# Patient Record
Sex: Male | Born: 1935 | Race: White | Hispanic: No | Smoking: Never smoker
Health system: Southern US, Community
[De-identification: ages and names within clinical notes are randomized; demographics above are authoritative.]

## PROBLEM LIST (undated history)

## (undated) DIAGNOSIS — N429 Disorder of prostate, unspecified: Secondary | ICD-10-CM

## (undated) DIAGNOSIS — K219 Gastro-esophageal reflux disease without esophagitis: Secondary | ICD-10-CM

---

## 2015-04-08 ENCOUNTER — Emergency Department
Admission: EM | Admit: 2015-04-08 | Discharge: 2015-04-08 | Disposition: A | Discharge status: Home / Self Care | Payer: Medicare Other | Attending: Emergency Medicine | Admitting: Emergency Medicine

## 2015-04-08 ENCOUNTER — Encounter: Payer: Self-pay | Admitting: Emergency Medicine

## 2015-04-08 ENCOUNTER — Emergency Department: Payer: Medicare Other

## 2015-04-08 DIAGNOSIS — R404 Transient alteration of awareness: Secondary | ICD-10-CM | POA: Insufficient documentation

## 2015-04-08 DIAGNOSIS — N390 Urinary tract infection, site not specified: Secondary | ICD-10-CM | POA: Insufficient documentation

## 2015-04-08 DIAGNOSIS — R4182 Altered mental status, unspecified: Secondary | ICD-10-CM | POA: Diagnosis present

## 2015-04-08 DIAGNOSIS — R001 Bradycardia, unspecified: Secondary | ICD-10-CM | POA: Diagnosis not present

## 2015-04-08 DIAGNOSIS — Z79899 Other long term (current) drug therapy: Secondary | ICD-10-CM | POA: Diagnosis not present

## 2015-04-08 HISTORY — DX: Gastro-esophageal reflux disease without esophagitis: K21.9

## 2015-04-08 HISTORY — DX: Disorder of prostate, unspecified: N42.9

## 2015-04-08 LAB — CBC WITH DIFFERENTIAL/PLATELET
BASOS ABS: 0 10*3/uL (ref 0–0.1)
BASOS PCT: 1 %
Eosinophils Absolute: 0.1 10*3/uL (ref 0–0.7)
Eosinophils Relative: 4 %
HEMATOCRIT: 34.2 % — AB (ref 40.0–52.0)
HEMOGLOBIN: 11.1 g/dL — AB (ref 13.0–18.0)
Lymphocytes Relative: 20 %
Lymphs Abs: 0.7 10*3/uL — ABNORMAL LOW (ref 1.0–3.6)
MCH: 29.7 pg (ref 26.0–34.0)
MCHC: 32.4 g/dL (ref 32.0–36.0)
MCV: 91.6 fL (ref 80.0–100.0)
MONOS PCT: 9 %
Monocytes Absolute: 0.3 10*3/uL (ref 0.2–1.0)
NEUTROS ABS: 2.5 10*3/uL (ref 1.4–6.5)
NEUTROS PCT: 68 %
Platelets: 179 10*3/uL (ref 150–440)
RBC: 3.73 MIL/uL — ABNORMAL LOW (ref 4.40–5.90)
RDW: 16.1 % — ABNORMAL HIGH (ref 11.5–14.5)
WBC: 3.8 10*3/uL (ref 3.8–10.6)

## 2015-04-08 LAB — COMPREHENSIVE METABOLIC PANEL
ALBUMIN: 3.1 g/dL — AB (ref 3.5–5.0)
ALT: <5 U/L — ABNORMAL LOW (ref 17–63)
ANION GAP: 8 (ref 5–15)
AST: 20 U/L (ref 15–41)
Alkaline Phosphatase: 76 U/L (ref 38–126)
BILIRUBIN TOTAL: 0.7 mg/dL (ref 0.3–1.2)
BUN: 17 mg/dL (ref 6–20)
CO2: 26 mmol/L (ref 22–32)
Calcium: 8.7 mg/dL — ABNORMAL LOW (ref 8.9–10.3)
Chloride: 105 mmol/L (ref 101–111)
Creatinine, Ser: 0.88 mg/dL (ref 0.61–1.24)
GFR calc Af Amer: >60 mL/min (ref >60)
GFR calc non Af Amer: >60 mL/min (ref >60)
GLUCOSE: 83 mg/dL (ref 65–99)
POTASSIUM: 3.7 mmol/L (ref 3.5–5.1)
Sodium: 139 mmol/L (ref 135–145)
TOTAL PROTEIN: 6.7 g/dL (ref 6.5–8.1)

## 2015-04-08 LAB — URINALYSIS COMPLETE WITH MICROSCOPIC (ARMC ONLY)
Bilirubin Urine: NEGATIVE
Glucose, UA: NEGATIVE mg/dL
Ketones, ur: NEGATIVE mg/dL
NITRITE: POSITIVE — AB
PROTEIN: NEGATIVE mg/dL
SPECIFIC GRAVITY, URINE: 1.009 (ref 1.005–1.030)
pH: 6 (ref 5.0–8.0)

## 2015-04-08 LAB — TROPONIN I

## 2015-04-08 MED ORDER — CEFTRIAXONE SODIUM 1 G IJ SOLR
1.0000 g | Freq: Once | INTRAMUSCULAR | Status: AC
  Administered 2015-04-08: 1 g via INTRAVENOUS
  Filled 2015-04-08: qty 10

## 2015-04-08 MED ORDER — SULFAMETHOXAZOLE-TRIMETHOPRIM 400-80 MG PO TABS: 1.0000 | ORAL_TABLET | Freq: Two times a day (BID) | ORAL | Status: AC

## 2015-04-08 NOTE — ED Notes (Signed)
[facility] notified for transport

## 2015-04-08 NOTE — ED Provider Notes (Addendum)
[facility] Emergency Department Provider Note  ____________________________________________  Time seen: Approximately 2:42 PM  I have reviewed the triage vital signs and the nursing notes.   HISTORY  Chief Complaint Altered Mental Status  History and physical exam are limited due to patient dementia.  HPI Mitchell Hawkins is a 81 y.o. male with a history of GERD and prostate disease who lives at [facility], brought today for unresponsiveness. The patient is unable to tell me why he is here. He states that he, "hurts all over."  Per nurse manager, CNA went to feed him and he could not be aroused despite verbal stimulus.  Some garbled speech, R facial droop with sternal rub.  140/66, 48, sats 94% on RA, 96.7  No recent illness or changes in medications.  Baseline: clear speech, not ambulatory.  HR ususally runs 52-70.   Past Medical History  Diagnosis Date  . GERD (gastroesophageal reflux disease)   . Prostate disease     There are no active problems to display for this patient.   History reviewed. No pertinent past surgical history.  Current Outpatient Rx  Name  Route  Sig  Dispense  Refill  . albuterol (PROVENTIL HFA;VENTOLIN HFA) 108 (90 BASE) MCG/ACT inhaler   Inhalation   Inhale 2 puffs into the lungs 3 (three) times daily as needed for wheezing or shortness of breath.         . calcium citrate-vitamin D (CITRACAL+D) 315-200 MG-UNIT per tablet   Oral   Take 1 tablet by mouth 2 (two) times daily.         . carbidopa-levodopa (SINEMET CR) 50-200 MG per tablet   Oral   Take 0.5 tablets by mouth 2 (two) times daily.         . carbidopa-levodopa (SINEMET CR) 50-200 MG per tablet   Oral   Take 1 tablet by mouth 2 (two) times daily.         Marland Kitchen ENSURE PLUS (ENSURE PLUS) LIQD   Oral   Take 237 mLs by mouth.         . finasteride (PROSCAR) 5 MG tablet   Oral   Take 5 mg by mouth daily.         Marland Kitchen ketoconazole (NIZORAL) 2 %  shampoo   Topical   Apply 1 application topically 2 (two) times a week. Monday and Friday         . Melatonin 3 MG TABS   Oral   Take 1 tablet by mouth at bedtime.         . miconazole (MICOTIN) 2 % powder   Topical   Apply 1 application topically as needed for itching.         . mirtazapine (REMERON) 15 MG tablet   Oral   Take 15 mg by mouth at bedtime.         . Multiple Vitamins-Minerals (MULTIVITAMIN WITH MINERALS) tablet   Oral   Take 1 tablet by mouth daily.         Marland Kitchen Petrolatum-Zinc Oxide (SENSI-CARE PROTECTIVE BARRIER) 49-15 % OINT   Apply externally   Apply 1 application topically 2 (two) times daily.         Marland Kitchen senna (SENOKOT) 8.6 MG TABS tablet   Oral   Take 2 tablets by mouth daily.         . tamsulosin (FLOMAX) 0.4 MG CAPS capsule   Oral   Take 0.4 mg by mouth daily.         Marland Kitchen  vitamin B-12 (CYANOCOBALAMIN) 1000 MCG tablet   Oral   Take 1,000 mcg by mouth daily.         Marland Kitchen sulfamethoxazole-trimethoprim (BACTRIM) 400-80 MG per tablet   Oral   Take 1 tablet by mouth 2 (two) times daily.   20 tablet   0     Allergies Review of patient's allergies indicates no known allergies.  History reviewed. No pertinent family history.  Social History Social History  Substance Use Topics  . Smoking status: Never Smoker   . Smokeless tobacco: None  . Alcohol Use: None    Review of Systems Unable to obtain due to patient patient mental status.   ____________________________________________   PHYSICAL EXAM:  VITAL SIGNS: ED Triage Vitals  Enc Vitals Group     BP 04/08/15 1434 126/72 mmHg     Pulse Rate 04/08/15 1434 52     Resp 04/08/15 1434 16     Temp 04/08/15 1434 98.9 F (37.2 C)     Temp Source 04/08/15 1434 Oral     SpO2 04/08/15 1434 97 %     Weight 04/08/15 1434 170 lb (77.111 kg)     Height 04/08/15 1434 5\' 10"  (1.778 m)     Head Cir --      Peak Flow --      Pain Score 04/08/15 1435 1     Pain Loc --      Pain Edu?  --      Excl. in GC? --     Constitutional: Alert and oriented to year, month, and name. The patient does not know where he has.. No acute distress. Eyes: At baseline, eyes remain closed. When I open them manually, he has symmetric pupils. Unable to comply with extraocular motion testing. Head: Atraumatic. Nose: No congestion/rhinnorhea. Mouth/Throat: Mucous membranes are dry. Neck: No stridor.  Supple.  No meningismus. Cardiovascular: Slow rate, regular rhythm. No murmurs, rubs or gallops.  Respiratory: Normal respiratory effort.  No retractions. Lungs CTAB.  No wheezes, rales or ronchi. Gastrointestinal: Soft and nontender. No distention. No peritoneal signs. Musculoskeletal: No LE edema. No calf tenderness or palpable cords. Neurologic:  Clear speech. Able to answer some questions appropriately. Able to wiggle toes bilaterally but unable to lift legs. Skin:  Skin is warm, dry and intact. No rash noted. Psychiatric: Mood and affect are normal.  ____________________________________________   LABS (all labs ordered are listed, but only abnormal results are displayed)  Labs Reviewed  COMPREHENSIVE METABOLIC PANEL - Abnormal; Notable for the following:    Calcium 8.7 (*)    Albumin 3.1 (*)    ALT <5 (*)    All other components within normal limits  CBC WITH DIFFERENTIAL/PLATELET - Abnormal; Notable for the following:    RBC 3.73 (*)    Hemoglobin 11.1 (*)    HCT 34.2 (*)    RDW 16.1 (*)    Lymphs Abs 0.7 (*)    All other components within normal limits  URINALYSIS COMPLETEWITH MICROSCOPIC ([facility] ONLY) - Abnormal; Notable for the following:    Color, Urine YELLOW (*)    APPearance HAZY (*)    Hgb urine dipstick 1+ (*)    Nitrite POSITIVE (*)    Leukocytes, UA 3+ (*)    Bacteria, UA RARE (*)    Squamous Epithelial / LPF 0-5 (*)    All other components within normal limits  TROPONIN I   ____________________________________________  EKG  ED ECG REPORT I, Rockne Menghini, the attending physician,  personally viewed and interpreted this ECG.   Date: 04/08/2015  EKG Time: 14:34   Rate: 56  Rhythm: sinus bradycardia  Axis: Leftward axis   Intervals:first-degree A-V block   ST&T Change: Poor baseline tracing. Nonspecific T-wave inversion in V1. No ST elevations.  No previous EKGs for comparison. ____________________________________________  RADIOLOGY  Dg Chest 2 View  04/08/2015   CLINICAL DATA:  Unresponsive.  EXAM: CHEST  2 VIEW  COMPARISON:  None.  FINDINGS: There is generalized interstitial prominence of uncertain etiology or chronicity. There is no airspace consolidation. There is mild elevation of the right hemidiaphragm. Heart is borderline enlarged with pulmonary vascularity within normal limits. There is atherosclerotic change in the aorta. There is degenerative change in the thoracic spine. There is evidence of previous kyphoplasty procedure at L1. No blastic or lytic lesions are appreciable. No demonstrable adenopathy.  IMPRESSION: Question a degree of congestive heart failure versus chronic interstitial fibrosis. Without prior studies, chronicity of the current findings cannot be ascertained. No airspace consolidation. Heart mildly enlarged. No adenopathy apparent.   Electronically Signed   By: Bretta Bang III M.D.   On: 04/08/2015 15:53   Ct Head Wo Contrast  04/08/2015   CLINICAL DATA:  Unresponsive.  EXAM: CT HEAD WITHOUT CONTRAST  TECHNIQUE: Contiguous axial images were obtained from the base of the skull through the vertex without intravenous contrast.  COMPARISON:  None.  FINDINGS: Mild cerebral atrophy. No acute intracranial abnormality. Specifically, no hemorrhage, hydrocephalus, mass lesion, acute infarction, or significant intracranial injury. No acute calvarial abnormality. 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 Visualized paranasal sinuses and mastoids clear. Orbital soft tissues unremarkable.   IMPRESSION: No acute intracranial abnormality.   Electronically Signed   By: Charlett Nose M.D.   On: 04/08/2015 15:34    ____________________________________________   PROCEDURES  Procedure(s) performed: None  Critical Care performed: No ____________________________________________   INITIAL IMPRESSION / ASSESSMENT AND PLAN / ED COURSE  Pertinent labs & imaging results that were available during my care of the patient were reviewed by me and considered in my medical decision making (see chart for details).  81 year old nursing home patient who sent for unresponsiveness. At this time, he is not unresponsive and his vital signs are normal except for sinus bradycardia. He has no findings on physical exam that would be concerning for acute pulmonary or abdominal infection. I will get basic labs, EKG, urinalysis, and attempt to discuss with patient's nurse to further elucidate the event that occurred this morning.  ----------------------------------------- 3:01 PM on 04/08/2015 -----------------------------------------  81 year old nursing home patient who is new to [facility]. He had a self-limited episode of unresponsiveness the smear morning. I will evaluate for intracranial bleed or stroke, abnormal labs, UTI. At this time, patient has sinus bradycardia which is likely his baseline given with the nursing home is told me. I will also call his son to get more information.  ----------------------------------------- 3:39 PM on 04/08/2015 -----------------------------------------  Patient CT head shows no acute intracranial abnormality.  ----------------------------------------- 4:45 PM on 04/08/2015 -----------------------------------------  The patient's UA does exhibit signs of urinary tract infection. Will initiate antibiotics, first dose here. Awaiting remaining labs and reassessment for further disposition.  ----------------------------------------- 4:52 PM on  04/08/2015 -----------------------------------------   D/W pt's son who is his POA.  He reports recurrent UTI's, and states that pt's MS, as described by me, is his baseline.  He is not planning to come tonight and understands that this limits my ability to  confirm that the pt is at his baseline.  We discussed abx and discharge versus admission, and the pt's son opts for discharge.  Pt's son is planning to transfer pt home tomorrow, and understands return and f/u precautions.    ____________________________________________  FINAL CLINICAL IMPRESSION(S) / ED DIAGNOSES  Final diagnoses:  Transient alteration of awareness  UTI (lower urinary tract infection)  Bradycardia      NEW MEDICATIONS STARTED DURING THIS VISIT:  New Prescriptions   SULFAMETHOXAZOLE-TRIMETHOPRIM (BACTRIM) 400-80 MG PER TABLET    Take 1 tablet by mouth 2 (two) times daily.     Rockne Menghini, MD 04/08/15 1653  Rockne Menghini, MD 04/08/15 1700  Rockne Menghini, MD 04/08/15 [phone]

## 2015-04-08 NOTE — ED Notes (Signed)
Pt sent from [facility].  Called ems for unresponsive.  EMS states when they got there pt was alert.  A&0 on arrival to ED.  Pt arrived with DNR paper.

## 2015-04-08 NOTE — ED Notes (Signed)
Patient transported to X-ray 

## 2015-04-08 NOTE — Discharge Instructions (Signed)
Altered Mental Status Altered mental status most often refers to an abnormal change in your responsiveness and awareness. It can affect your speech, thought, mobility, memory, attention span, or alertness. It can range from slight confusion to complete unresponsiveness (coma). Altered mental status can be a sign of a serious underlying medical condition. Rapid evaluation and medical treatment is necessary for patients having an altered mental status. CAUSES   Low blood sugar (hypoglycemia) or diabetes.  Severe loss of body fluids (dehydration) or a body salt (electrolyte) imbalance.  A stroke or other neurologic problem, such as dementia or delirium.  A head injury or tumor.  A drug or alcohol overdose.  Exposure to toxins or poisons.  Depression, anxiety, and stress.  A low oxygen level (hypoxia).  An infection.  Blood loss.  Twitching or shaking (seizure).  Heart problems, such as heart attack or heart rhythm problems (arrhythmias).  A body temperature that is too low or too high (hypothermia or hyperthermia). DIAGNOSIS  A diagnosis is based on your history, symptoms, physical and neurologic examinations, and diagnostic tests. Diagnostic tests may include:  Measurement of your blood pressure, pulse, breathing, and oxygen levels (vital signs).  Blood tests.  Urine tests.  X-ray exams.  A computerized magnetic scan (magnetic resonance imaging, MRI).  A computerized X-ray scan (computed tomography, CT scan). TREATMENT  Treatment will depend on the cause. Treatment may include:  Management of an underlying medical or mental health condition.  Critical care or support in the hospital. HOME CARE INSTRUCTIONS   Only take over-the-counter or prescription medicines for pain, discomfort, or fever as directed by your caregiver.  Manage underlying conditions as directed by your caregiver.  Eat a healthy, well-balanced diet to maintain strength.  Join a support group or  prevention program to cope with the condition or trauma that caused the altered mental status. Ask your caregiver to help choose a program that works for you.  Follow up with your caregiver for further examination, therapy, or testing as directed. SEEK MEDICAL CARE IF:   You feel unwell or have chills.  You or your family notice a change in your behavior or your alertness.  You have trouble following your caregiver's treatment plan.  You have questions or concerns. SEEK IMMEDIATE MEDICAL CARE IF:   You have a rapid heartbeat or have chest pain.  You have difficulty breathing.  You have a fever.  You have a headache with a stiff neck.  You cough up blood.  You have blood in your urine or stool.  You have severe agitation or confusion. MAKE SURE YOU:   Understand these instructions.  Will watch your condition.  Will get help right away if you are not doing well or get worse. Document Released: 12/28/2009 Document Revised: 10/02/2011 Document Reviewed: 12/28/2009 [facility] Patient Information 2015 [place], [place]. This information is not intended to replace advice given to you by your health care provider. Make sure you discuss any questions you have with your health care provider.   Please start the Bactrim tablets tomorrow morning.  Please contact Mr. Juanetta Gosling primary care physician to review any prior urine cultures, as I do not have access to these in the [facility] emergency department. This may change the antibiotic that he needs to be prescribed.  Please return to the emergency department for fever, vomiting, changes in mental status, abdominal pain, or any other symptoms concerning to you.

## 2015-10-23 DEATH — deceased

## 2015-11-22 DEATH — deceased

## 2016-08-30 IMAGING — CT CT HEAD W/O CM
1 series · 12 of 29 positions shown, 15 images · non-contrast
Comparison: None.

CLINICAL DATA: Unresponsive.

EXAM:
CT HEAD WITHOUT CONTRAST
TECHNIQUE: Contiguous axial images were obtained from the base of the skull
through the vertex without intravenous contrast.

[Series 3: thorax 5.0 spo · axial · 0.35mm/px · z∈[-141,-24]mm · 12 of 29 slices shown, 15 images]
[im 3/29  brain]
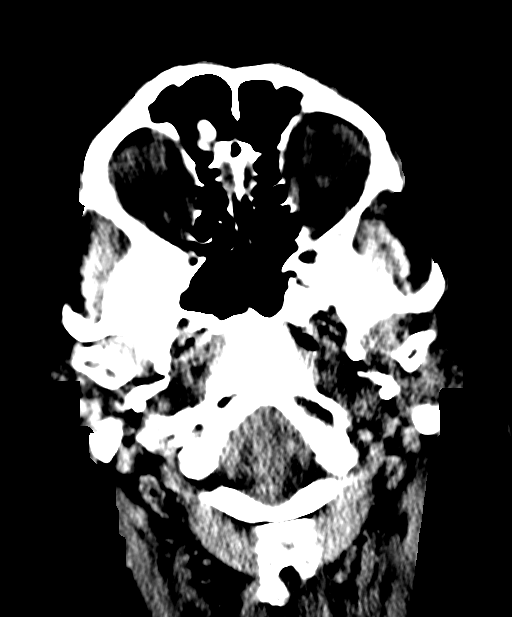
[im 3/29  bone]
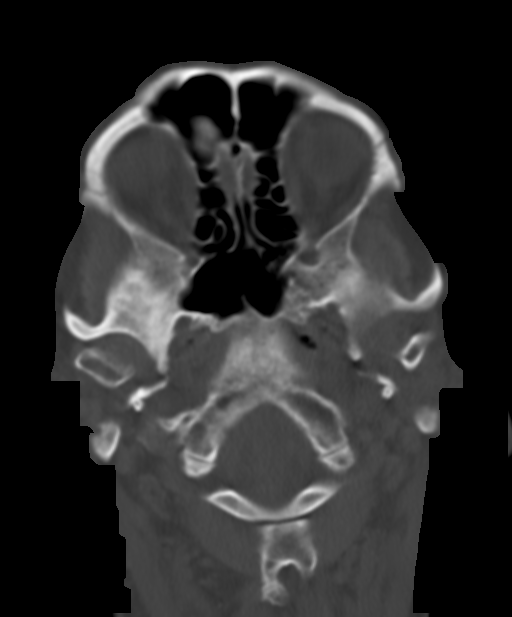
[im 5/29  brain]
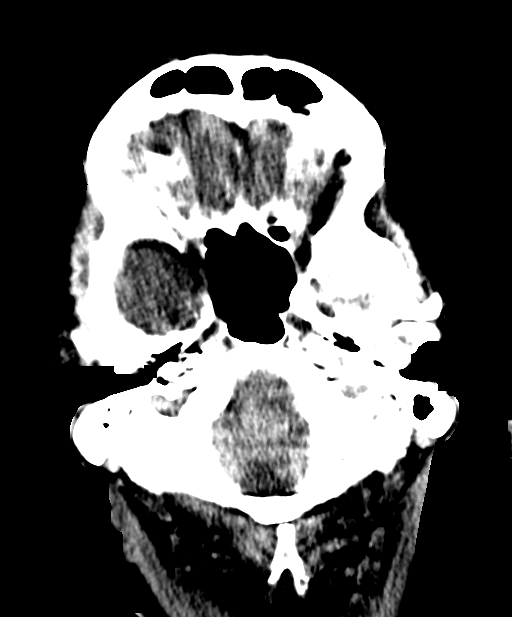
[im 7/29  brain]
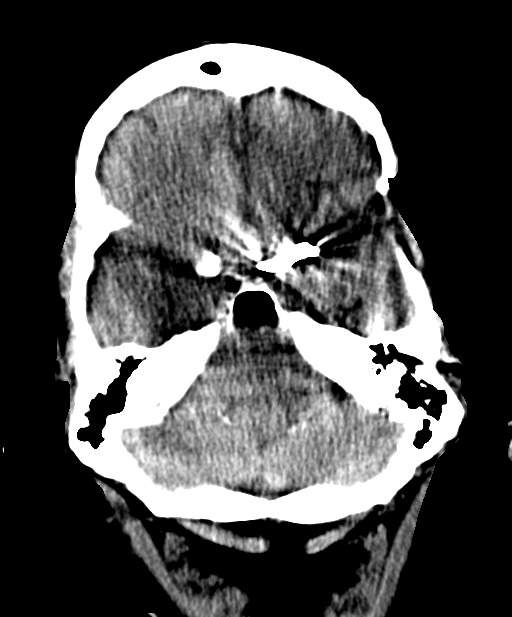
[im 9/29  brain]
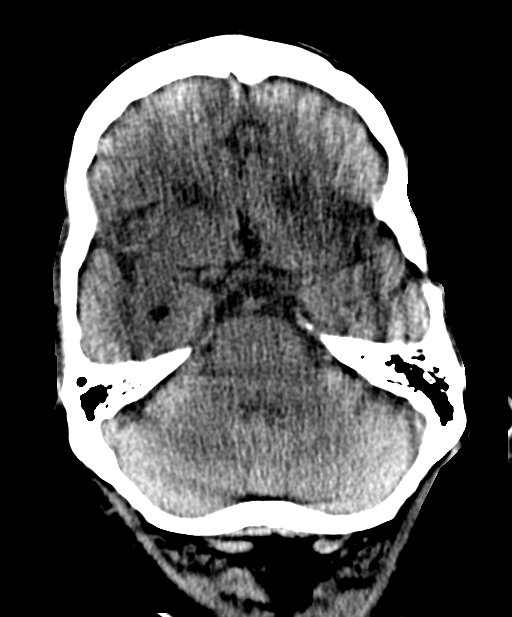
[im 12/29  brain]
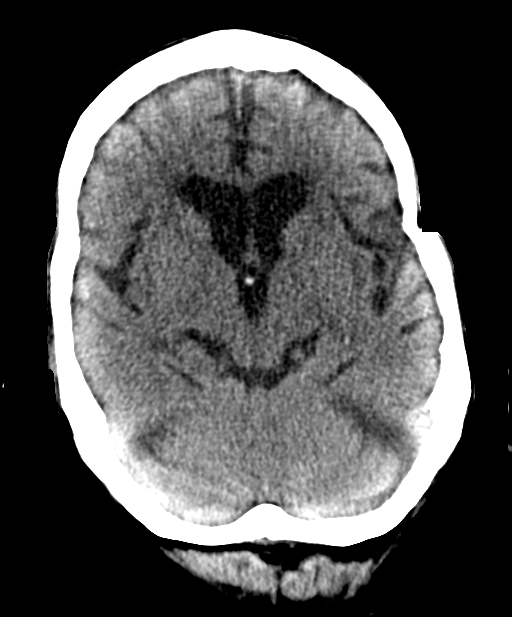
[im 12/29  bone]
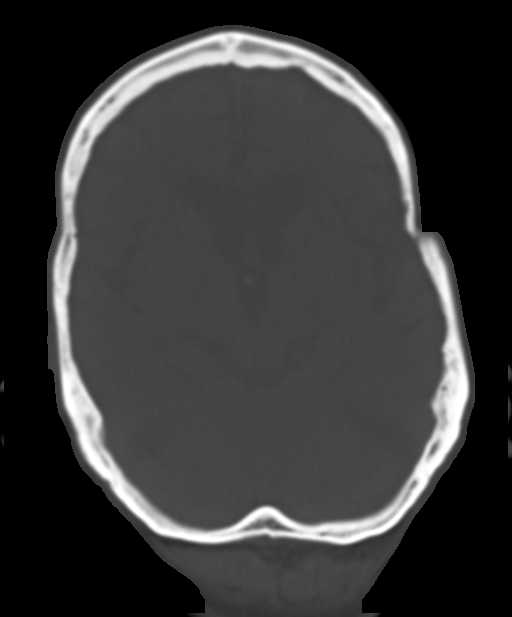
[im 14/29  brain]
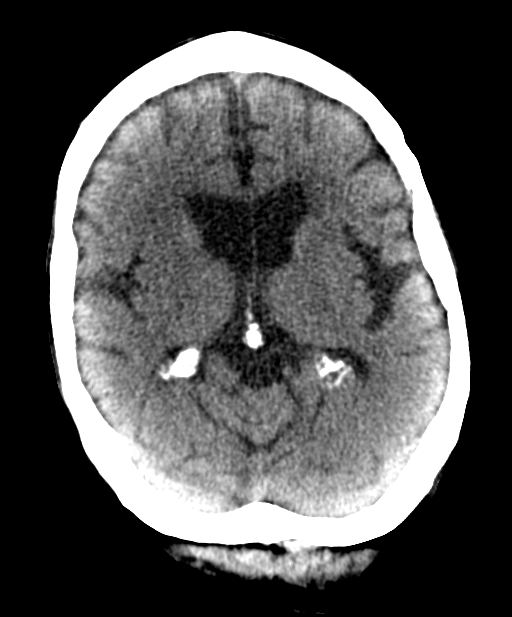
[im 16/29  brain]
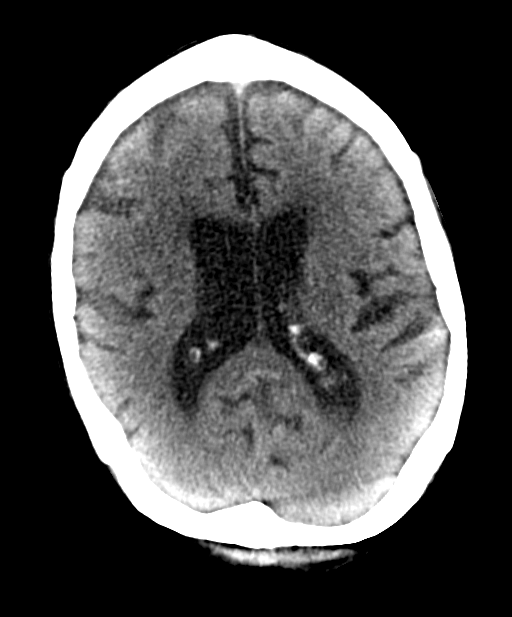
[im 18/29  brain]
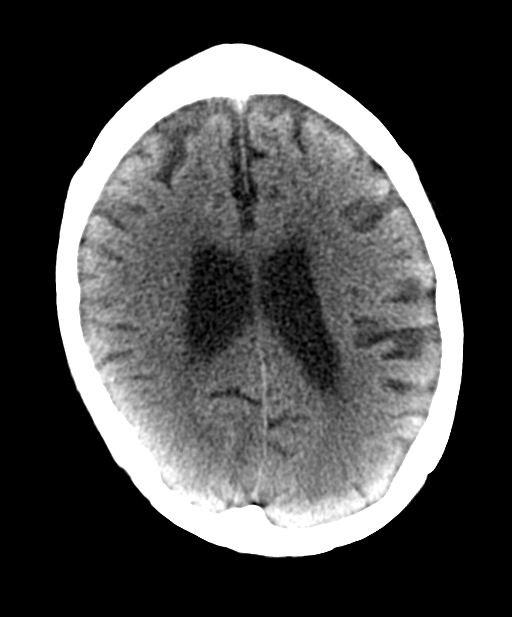
[im 21/29  brain]
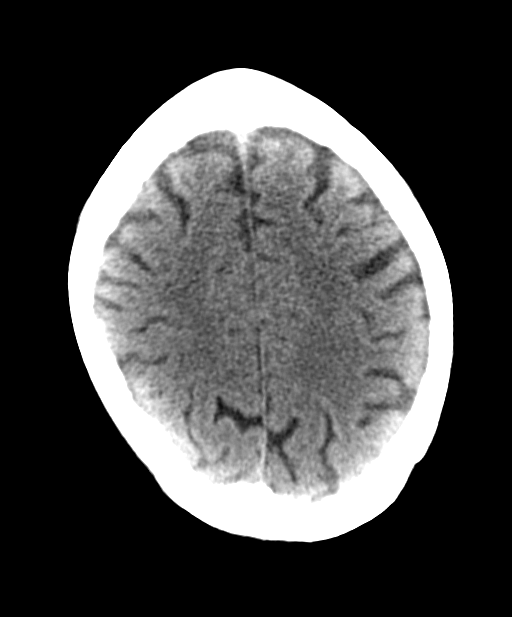
[im 21/29  bone]
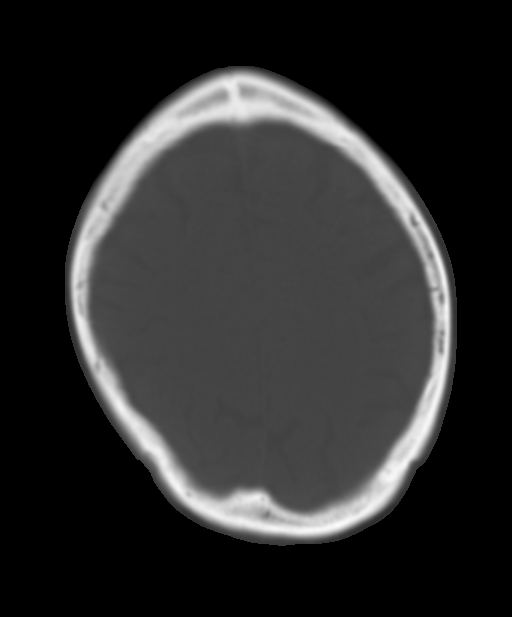
[im 23/29  brain]
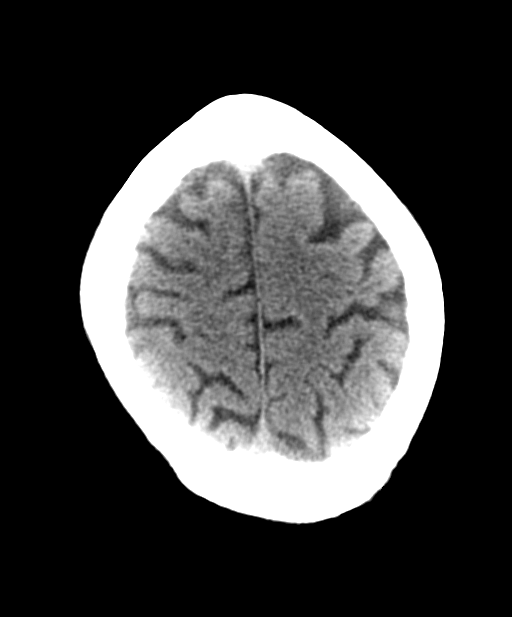
[im 25/29  brain]
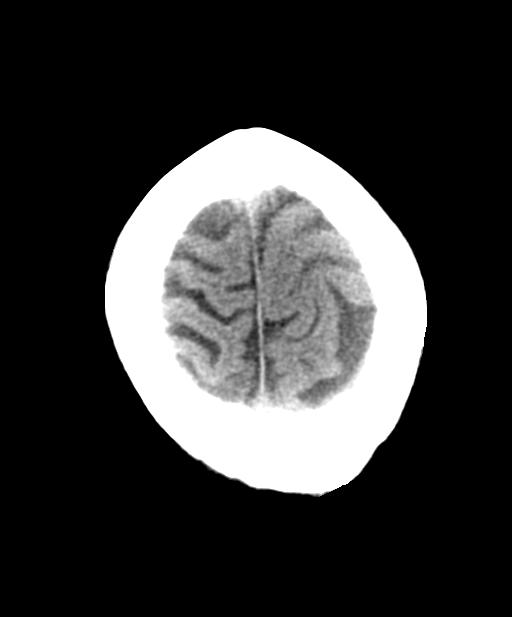
[im 27/29  brain]
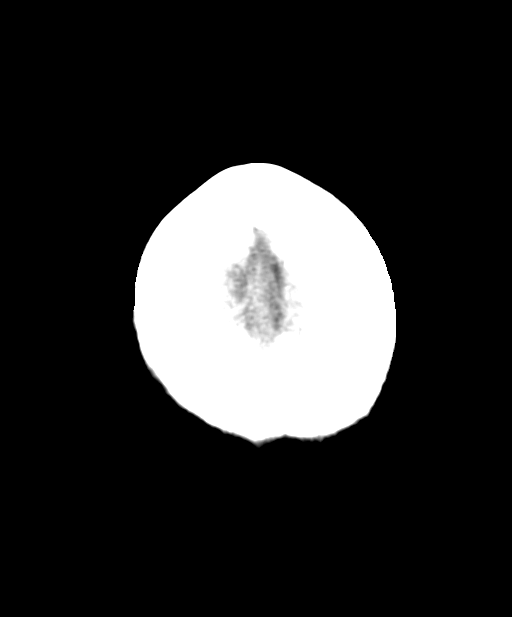

[12 of 29 positions shown; findings below may reference images not displayed]

FINDINGS: Mild cerebral atrophy. No acute intracranial abnormality.
Specifically, no hemorrhage, hydrocephalus, mass lesion, acute
infarction, or significant intracranial injury. No acute calvarial
abnormality. Prior left temporal craniotomy and aneurysm clipping on
the left. Visualized paranasal sinuses and mastoids clear. Orbital
soft tissues unremarkable.
IMPRESSION: No acute intracranial abnormality.

## 2016-08-30 IMAGING — CR DG CHEST 2V
1 series · 2 of 2 positions shown · non-contrast
Comparison: None.

CLINICAL DATA: Unresponsive.

EXAM:
CHEST  2 VIEW

[Series 1: x chest ap · 0.14mm/px · 2 of 2 slices shown]
[im 1/2]
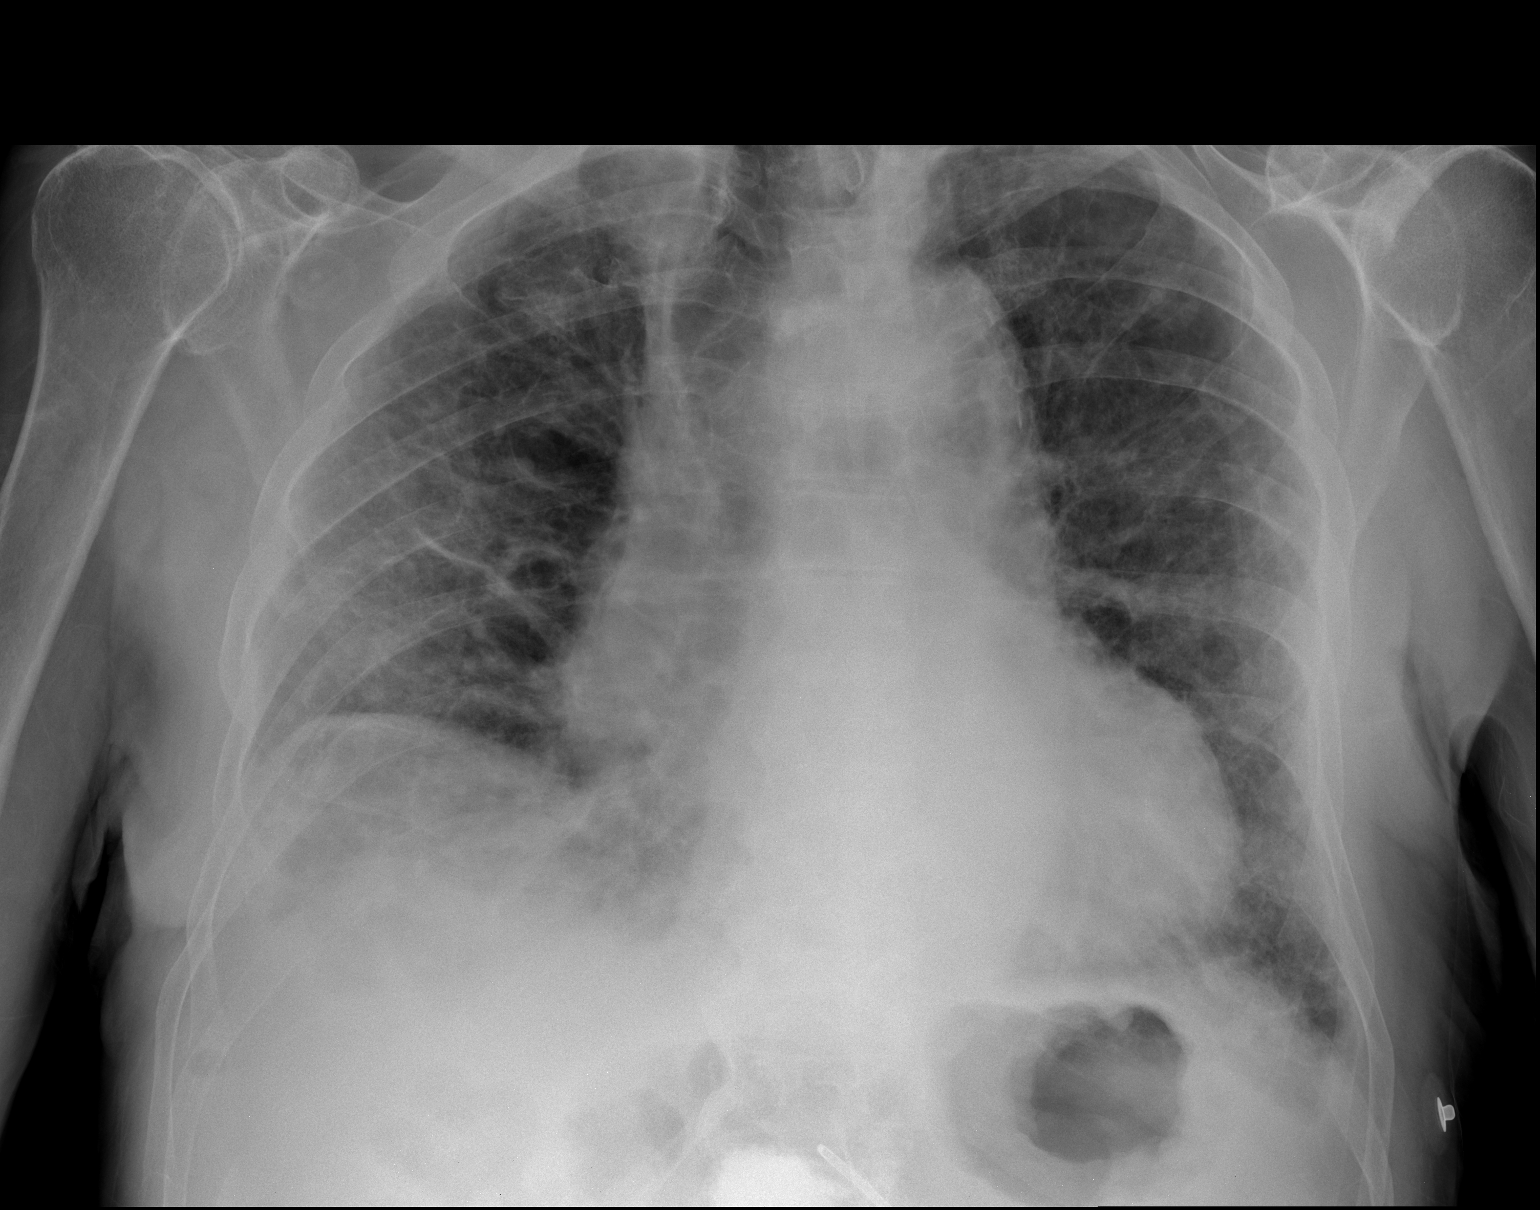
[im 2/2]
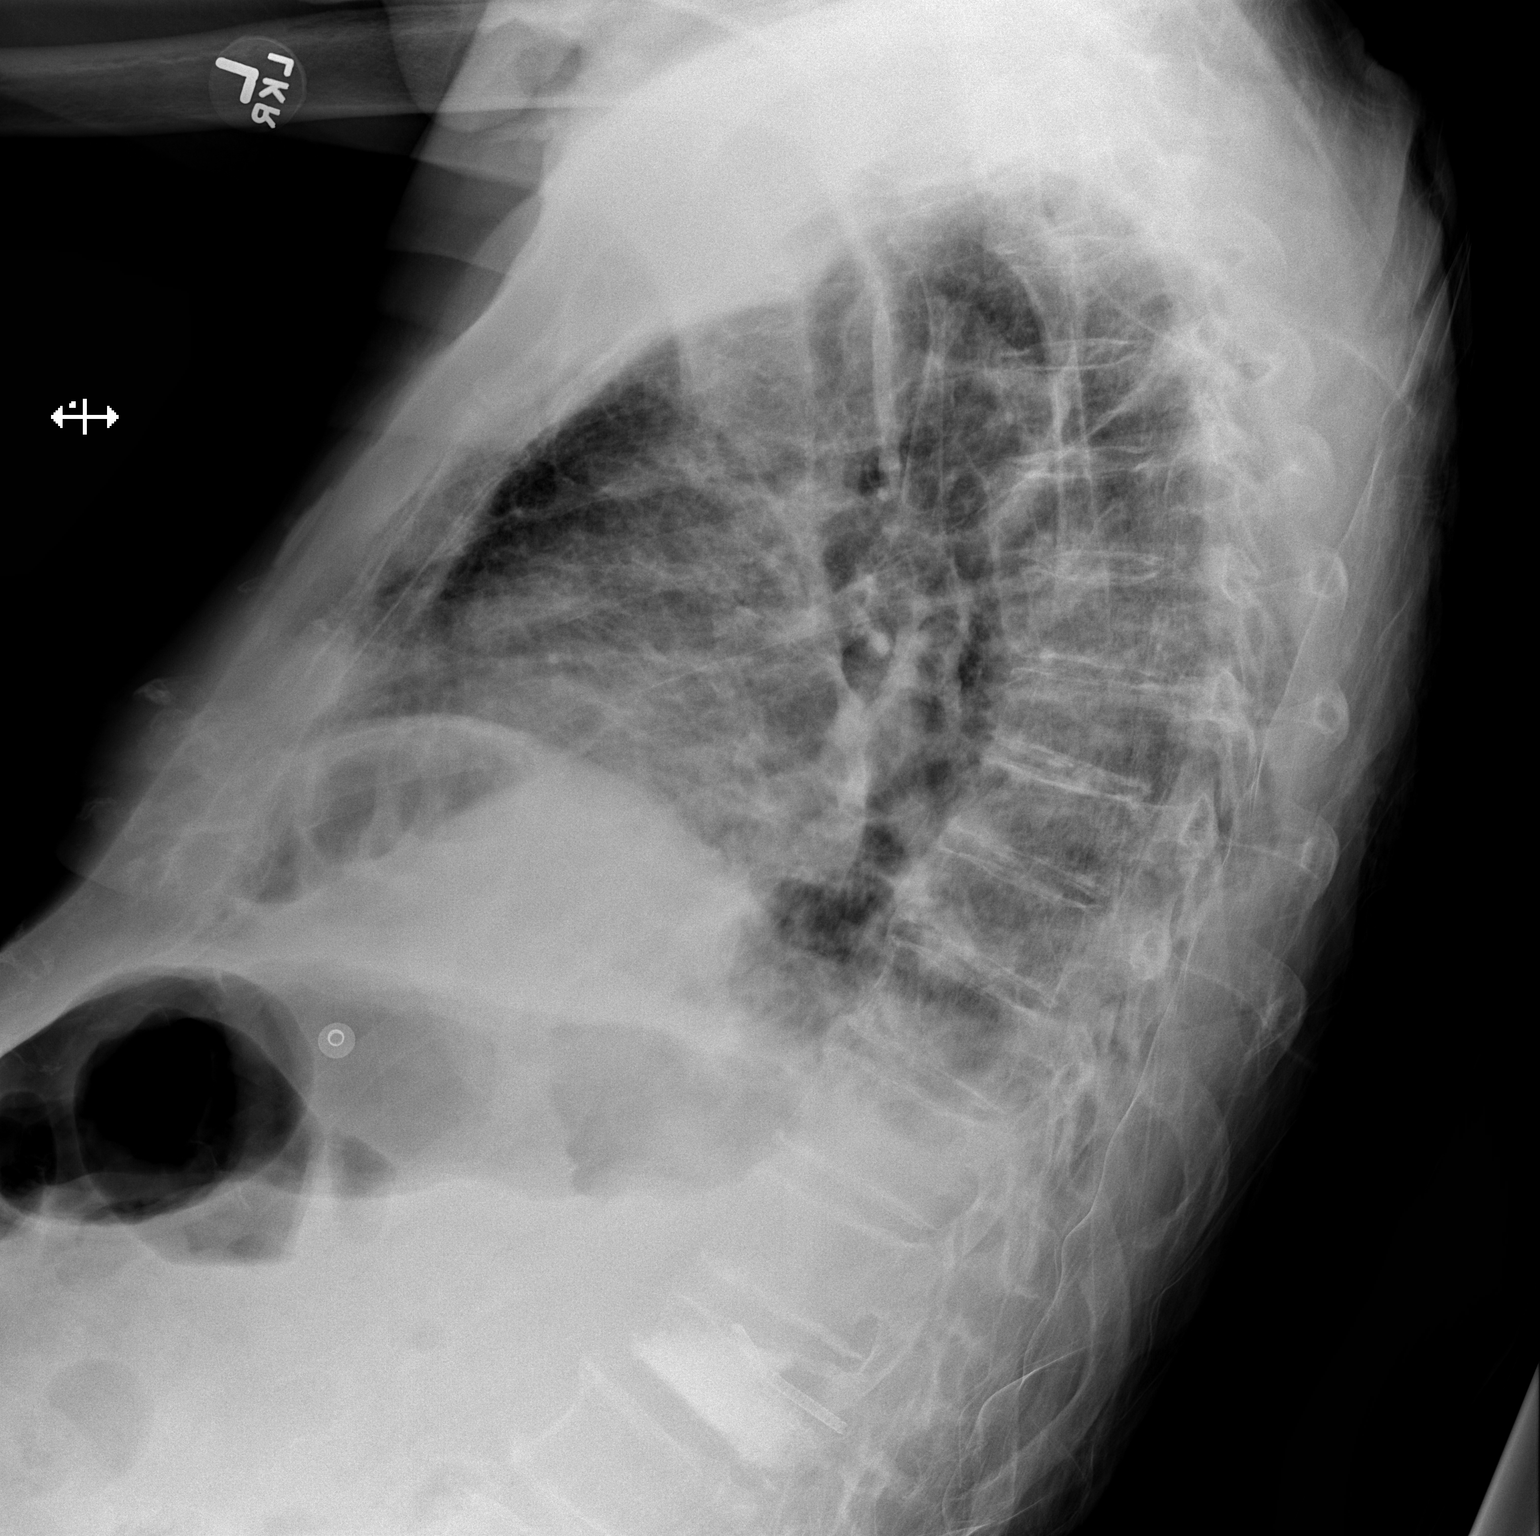

[2 of 2 positions shown; findings below may reference images not displayed]

FINDINGS: There is generalized interstitial prominence of uncertain etiology
or chronicity. There is no airspace consolidation. There is mild
elevation of the right hemidiaphragm. Heart is borderline enlarged
with pulmonary vascularity within normal limits. There is
atherosclerotic change in the aorta. There is degenerative change in
the thoracic spine. There is evidence of previous kyphoplasty
procedure at L1. No blastic or lytic lesions are appreciable. No
demonstrable adenopathy.
IMPRESSION: Question a degree of congestive heart failure versus chronic
interstitial fibrosis. Without prior studies, chronicity of the
current findings cannot be ascertained. No airspace consolidation.
Heart mildly enlarged. No adenopathy apparent.
